# Patient Record
Sex: Male | Born: 1950 | Race: White | Hispanic: No | Marital: Married | Smoking: Never smoker
Health system: Southern US, Community
[De-identification: ages and names within clinical notes are randomized; demographics above are authoritative.]

## PROBLEM LIST (undated history)

## (undated) DIAGNOSIS — I10 Essential (primary) hypertension: Secondary | ICD-10-CM

## (undated) DIAGNOSIS — M549 Dorsalgia, unspecified: Secondary | ICD-10-CM

## (undated) HISTORY — DX: Dorsalgia, unspecified: M54.9

## (undated) HISTORY — PX: POLYPECTOMY: SHX149

## (undated) HISTORY — PX: NO PAST SURGERIES: SHX2092

## (undated) HISTORY — DX: Essential (primary) hypertension: I10

## (undated) HISTORY — PX: COLONOSCOPY: SHX174

---

## 2004-06-28 ENCOUNTER — Encounter: Payer: Self-pay | Admitting: Internal Medicine

## 2005-04-10 ENCOUNTER — Ambulatory Visit (HOSPITAL_COMMUNITY): Admission: RE | Admit: 2005-04-10 | Discharge: 2005-04-10 | Payer: Self-pay | Admitting: Plastic Surgery

## 2005-04-10 ENCOUNTER — Ambulatory Visit (HOSPITAL_BASED_OUTPATIENT_CLINIC_OR_DEPARTMENT_OTHER): Admission: RE | Admit: 2005-04-10 | Discharge: 2005-04-10 | Payer: Self-pay | Admitting: Plastic Surgery

## 2005-04-10 ENCOUNTER — Encounter (INDEPENDENT_AMBULATORY_CARE_PROVIDER_SITE_OTHER): Payer: Self-pay | Admitting: *Deleted

## 2007-01-27 ENCOUNTER — Ambulatory Visit: Payer: Self-pay | Admitting: Internal Medicine

## 2007-01-27 LAB — CONVERTED CEMR LAB
Basophils Absolute: 0 10*3/uL (ref 0.0–0.1)
Basophils Relative: 0.2 % (ref 0.0–1.0)
Eosinophils Absolute: 0.1 10*3/uL (ref 0.0–0.6)
Eosinophils Relative: 1.1 % (ref 0.0–5.0)
HCT: 44.8 % (ref 39.0–52.0)
Hemoglobin: 15.2 g/dL (ref 13.0–17.0)
Lymphocytes Relative: 33.3 % (ref 12.0–46.0)
MCHC: 34.1 g/dL (ref 30.0–36.0)
MCV: 92.2 fL (ref 78.0–100.0)
Monocytes Absolute: 0.6 10*3/uL (ref 0.2–0.7)
Monocytes Relative: 9.6 % (ref 3.0–11.0)
Neutro Abs: 3.2 10*3/uL (ref 1.4–7.7)
Neutrophils Relative %: 55.8 % (ref 43.0–77.0)
Platelets: 230 10*3/uL (ref 150–400)
RBC: 4.85 M/uL (ref 4.22–5.81)
RDW: 12.6 % (ref 11.5–14.6)
WBC: 5.9 10*3/uL (ref 4.5–10.5)

## 2007-02-12 ENCOUNTER — Ambulatory Visit: Payer: Self-pay | Admitting: Internal Medicine

## 2007-02-12 LAB — CONVERTED CEMR LAB
Fecal Occult Blood: NEGATIVE
Fecal Occult Blood: NEGATIVE
OCCULT 1: NEGATIVE
OCCULT 1: NEGATIVE
OCCULT 2: NEGATIVE
OCCULT 2: NEGATIVE
OCCULT 3: NEGATIVE
OCCULT 3: NEGATIVE
OCCULT 4: NEGATIVE
OCCULT 4: NEGATIVE
OCCULT 5: NEGATIVE
OCCULT 5: NEGATIVE

## 2007-10-30 ENCOUNTER — Encounter: Payer: Self-pay | Admitting: Family Medicine

## 2007-10-30 DIAGNOSIS — Z593 Problems related to living in residential institution: Secondary | ICD-10-CM | POA: Insufficient documentation

## 2009-07-26 ENCOUNTER — Ambulatory Visit: Payer: Self-pay | Admitting: Internal Medicine

## 2009-07-26 DIAGNOSIS — K625 Hemorrhage of anus and rectum: Secondary | ICD-10-CM | POA: Insufficient documentation

## 2009-08-07 ENCOUNTER — Ambulatory Visit: Payer: Self-pay | Admitting: Internal Medicine

## 2009-08-08 ENCOUNTER — Encounter: Payer: Self-pay | Admitting: Internal Medicine

## 2010-11-01 NOTE — Procedures (Signed)
Summary: colonoscopy   Colonoscopy  Procedure date:  06/28/2004  Findings:      Location:  [facility].  Results: Normal.  Patient Name: Micheal Schultz, Micheal Schultz MRN:  Procedure Procedures: Colonoscopy CPT: [id number].  Personnel: Endoscopist: Wilhemina Bonito. Marina Goodell, MD.  Referred By: Pearletha Furl Jacky Kindle, MD.  Exam Location: Exam performed in Outpatient Clinic. Outpatient  Patient Consent: Procedure, Alternatives, Risks and Benefits discussed, consent obtained, from patient. Consent was obtained by the RN.  Indications  Average Risk Screening Routine.  History  Current Medications: Patient is not currently taking Coumadin.  Pre-Exam Physical: Performed Jun 28, 2004. Entire physical exam was normal.  Exam Exam: Extent of exam reached: Cecum, extent intended: Cecum.  The cecum was identified by appendiceal orifice and IC valve. Patient position: on left side. Colon retroflexion performed. Images taken. ASA Classification: I. Tolerance: excellent.  Monitoring: Pulse and BP monitoring, Oximetry used. Supplemental O2 given.  Colon Prep Used MIRALAX for colon prep. Prep results: excellent.  Sedation Meds: Patient assessed and found to be appropriate for moderate (conscious) sedation. Fentanyl 50 mcg. given IV. Versed 5 mg. given IV.  Findings NORMAL EXAM: Cecum to Rectum.    Comments: NO POLYPS SEEN Assessment Normal examination.  Events  Unplanned Interventions: No intervention was required.  Unplanned Events: There were no complications. Plans Disposition: After procedure patient sent to recovery. After recovery patient sent home.  Scheduling/Referral: Colonoscopy, to Wilhemina Bonito. Marina Goodell, MD, IN ABOUT 7 YEARS FOR REPEAT SCREENING,   Comments: RETURN TO THE CARE OF DR. Jacky Kindle  This report was created from the original endoscopy report, which was reviewed and signed by the above listed endoscopist.   cc:  Geoffry Paradise, MD      The Patient

## 2010-11-01 NOTE — Procedures (Signed)
Summary: Colonoscopy  Patient: [name] Note: All result statuses are Final unless otherwise noted.  Tests: (1) Colonoscopy (COL)   COL Colonoscopy           DONE     [facility]     [address]     [place], [place]  [address]           COLONOSCOPY PROCEDURE REPORT           PATIENT:  Micheal Schultz, Micheal Schultz  MR#:  [id number]     BIRTHDATE:  10/22/1950, 58 yrs. old  GENDER:  male           ENDOSCOPIST:  Wilhemina Bonito. Eda Keys, MD     Referred by:  Office           PROCEDURE DATE:  08/07/2009     PROCEDURE:  Colonoscopy with snare polypectomy     ASA CLASS:  Class I     INDICATIONS:  rectal bleeding           MEDICATIONS:   Fentanyl 100 mcg IV, Versed 10 mg IV           DESCRIPTION OF PROCEDURE:   After the risks benefits and     alternatives of the procedure were thoroughly explained, informed     consent was obtained.  Digital rectal exam was performed and     revealed no abnormalities.   The LB CF-[id number] endoscope     was introduced through the anus and advanced to the cecum, which     was identified by both the appendix and ileocecal valve, without     limitations.Time to cecum = 4:44 min. The quality of the prep was     excellent, using MoviPrep.  The instrument was then withdrawn     (time = 11:40 min) as the colon was fully examined.     <<PROCEDUREIMAGES>>           FINDINGS:  Three polyps were found; 2mm, 2mm in the ascending     colon and 2mm in rectum. Polyps were snared without cautery.     Retrieval was successful. Moderate focal diverticulosis in sigmoid     colon.   Retroflexed views in the rectum revealed internal     hemorrhoids.    The scope was then withdrawn from the patient and     the procedure completed.           COMPLICATIONS:  None           ENDOSCOPIC IMPRESSION:     1) Three polyps - removed     2) Internal hemorrhoids- small     3) Sigmoid Diverticulosis           RECOMMENDATIONS:     1) Repeat colonoscopy in 5 years if polyp  adenomatous; otherwise     10 years           ______________________________     Wilhemina Bonito. Eda Keys, MD           CC:  Geoffry Paradise, MD; The Patient           n.     eSIGNED:   Wilhemina Bonito. Eda Keys at 08/07/2009 10:41 AM           Shaune Spittle, [id number]  Note: An exclamation mark (!) indicates a result that was not dispersed into the flowsheet. Document Creation Date: 08/07/2009 10:41 AM _______________________________________________________________________  (1) Order result status: Final Collection or  observation date-time: 08/07/2009 10:22 Requested date-time:  Receipt date-time:  Reported date-time:  Referring Physician:   Ordering Physician: Fransico Setters [phone]) Specimen Source:  Source: Launa Grill Order Number: [id number] Lab site:   Appended Document: Colonoscopy     Procedures Next Due Date:    Colonoscopy: 07/2014

## 2010-11-01 NOTE — Letter (Signed)
Summary: [facility] Instructions  [facility]  [address] [place], [place] [address]   Phone: [phone]  Fax: [phone]       Micheal Schultz    [birth year]    MRN: [id number]        Procedure Day /Date:08/07/09 Operating Room Services     Arrival Time:9:30 a.m.     Procedure Time:10:30 a.m.    Location of Procedure:                    X  [facility] (4th Floor) _ _  [facility] ( Outpatient Registration) _ _  [facility] ( Short Stay on [facility])                       PREPARATION FOR COLONOSCOPY WITH MOVIPREP   Starting 5 days prior to your procedure 08/02/09 do not eat nuts, seeds, popcorn, corn, beans, peas,  salads, or any raw vegetables.  Do not take any fiber supplements (e.g. Metamucil, Citrucel, and Benefiber).  THE DAY BEFORE YOUR PROCEDURE         DATE: 08/06/09 DAY: SUNDAY  1.  Drink clear liquids the entire day-NO SOLID FOOD  2.  Do not drink anything colored red or purple.  Avoid juices with pulp.  No orange juice.  3.  Drink at least 64 oz. (8 glasses) of fluid/clear liquids during the day to prevent dehydration and help the prep work efficiently.  CLEAR LIQUIDS INCLUDE: Water Jello Ice Popsicles Tea (sugar ok, no milk/cream) Powdered fruit flavored drinks Coffee (sugar ok, no milk/cream) Gatorade Juice: apple, white grape, white cranberry  Lemonade Clear bullion, consomm, broth Carbonated beverages (any kind) Strained chicken noodle soup Hard Candy                             4.  In the morning, mix first dose of MoviPrep solution:    Empty 1 Pouch A and 1 Pouch B into the disposable container    Add lukewarm drinking water to the top line of the container. Mix to dissolve    Refrigerate (mixed solution should be used within 24 hrs)  5.  Begin drinking the prep at 5:00 p.m. The MoviPrep container is divided by 4 marks.   Every 15 minutes drink the solution down to the next mark (approximately 8 oz) until the  full liter is complete.   6.  Follow completed prep with 16 oz of clear liquid of your choice (Nothing red or purple).  Continue to drink clear liquids until bedtime.  7.  Before going to bed, mix second dose of MoviPrep solution:    Empty 1 Pouch A and 1 Pouch B into the disposable container    Add lukewarm drinking water to the top line of the container. Mix to dissolve    Refrigerate  THE DAY OF YOUR PROCEDURE      DATE: 08/07/09 DAY: MONDAY  Beginning at 5:30 a.m. (5 hours before procedure):         1. Every 15 minutes, drink the solution down to the next mark (approx 8 oz) until the full liter is complete.  2. Follow completed prep with 16 oz. of clear liquid of your choice.    3. You may drink clear liquids until 8:30 a.m.(2 HOURS BEFORE PROCEDURE).   MEDICATION INSTRUCTIONS  Unless otherwise instructed, you should take regular prescription medications with a  small sip of water   as early as possible the morning of your procedure.         OTHER INSTRUCTIONS  You will need a responsible adult at least 58 years of age to accompany you and drive you home.   This person must remain in the waiting room during your procedure.  Wear loose fitting clothing that is easily removed.  Leave jewelry and other valuables at home.  However, you may wish to bring a book to read or  an iPod/MP3 player to listen to music as you wait for your procedure to start.  Remove all body piercing jewelry and leave at home.  Total time from sign-in until discharge is approximately 2-3 hours.  You should go home directly after your procedure and rest.  You can resume normal activities the  day after your procedure.  The day of your procedure you should not:   Drive   Make legal decisions   Operate machinery   Drink alcohol   Return to work  You will receive specific instructions about eating, activities and medications before you leave.    The above instructions have been  reviewed and explained to me by   _______________________    I fully understand and can verbalize these instructions _____________________________ Date _________

## 2010-11-01 NOTE — Miscellaneous (Signed)
  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 

## 2010-11-01 NOTE — Letter (Signed)
Summary: Patient Notice- Polyp Results  [facility]  [address] [place], [place] [address]   Phone: [phone]  Fax: [phone]        August 08, 2009 MRN: [id number]    Micheal Schultz [address] [place], [place]  [address]    Dear [name],    I am pleased to inform you that the colon polyps removed during your recent colonoscopy were found to be benign (no cancer detected) upon pathologic examination. They were, however, the precancerous adenomatous type.  I recommend you have a repeat colonoscopy examination in 5 years to look for recurrent polyps, as having colon polyps increases your risk for having recurrent polyps or even colon cancer in the future.  Should you develop new or worsening symptoms of abdominal pain, bowel habit changes or bleeding from the rectum or bowels, please schedule an evaluation with either your primary care physician or with me.  Additional information/recommendations:  __ No further action with gastroenterology is needed at this time. Please      follow-up with your primary care physician for your other healthcare      needs.   Please call us if you are having persistent problems or have questions about your condition that have not been fully answered at this time.  Sincerely,  Hilarie Fredrickson MD  This letter has been electronically signed by your physician.  Appended Document: Patient Notice- Polyp Results Letter mailed 11.10.10

## 2010-11-01 NOTE — Assessment & Plan Note (Signed)
Summary: RECTAL BLEEDING...EM   History of Present Illness Visit Type: consult  Primary GI MD: Yancey Flemings MD Primary Provider: Geoffry Paradise, MD  Requesting Provider: Geoffry Paradise, MD Chief Complaint: Pt states BRB in stool after bowel movements  History of Present Illness:   60 year old with no significant past medical history. He presents today regarding rectal bleeding. The patient underwent colonoscopy in September of 2005 for routine screening. Examination was normal. He was seen in April 2008 for rectal bleeding. No worrisome features. He was felt to have benign anorectal pathology. Hemoccult studies were negative. At this time he reports recurrent rectal bleeding about 6 weeks ago. He describes 3 discrete episodes. No associated abdominal rectal pain. No nausea vomiting or unexplained weight loss. No interval development of colon cancer in his family. Hemoccult studies in June were negative. Review of outside blood work reveals normal hemoglobin of 16.1. CBC and comprehensive metabolic panel entirely normal as well.   GI Review of Systems      Denies abdominal pain, acid reflux, belching, bloating, chest pain, dysphagia with liquids, dysphagia with solids, heartburn, loss of appetite, nausea, vomiting, vomiting blood, weight loss, and  weight gain.      Reports rectal bleeding.     Denies anal fissure, black tarry stools, change in bowel habit, constipation, diarrhea, diverticulosis, fecal incontinence, heme positive stool, hemorrhoids, irritable bowel syndrome, jaundice, light color stool, liver problems, and  rectal pain.    Current Medications (verified): 1)  None  Allergies (verified): No Known Drug Allergies  Past History:  Past Medical History: Reviewed history from 07/21/2009 and no changes required. Unremarkable  Past Surgical History: Reviewed history from 07/21/2009 and no changes required. Unremarkable  Family History: No FH of Colon Cancer:  Social  History: Occupation: Administrator  Married  Patient currently smokes.: Occ cigar  Alcohol Use - yes: Glass of wine daily and on weekends  Daily Caffeine Use: coffee in the morning  Illicit Drug Use - no Patient gets regular exercise. Smoking Status:  current Drug Use:  no Does Patient Exercise:  yes  Review of Systems       entirely negative review of systems  Vital Signs:  Patient profile:   60 year old male Height:      70 inches Weight:      183 pounds BMI:     26.35 BSA:     2.01 Pulse rate:   72 / minute Pulse rhythm:   regular BP sitting:   116 / 80  (left arm) Cuff size:   regular  Vitals Entered By: Ok Anis CMA (July 26, 2009 8:38 AM)  Physical Exam  General:  Well developed, well nourished, no acute distress. Head:  Normocephalic and atraumatic. Eyes:  PERRLA, no icterus. Nose:  No deformity, discharge,  or lesions. Mouth:  No deformity or lesions, dentition normal. Neck:  Supple; no masses or thyromegaly. Lungs:  Clear throughout to auscultation. Heart:  Regular rate and rhythm; no murmurs, rubs,  or bruits. Abdomen:  Soft, nontender and nondistended. No masses, hepatosplenomegaly or hernias noted. Normal bowel sounds. Rectal:  deferred Prostate:  deferred Msk:  Symmetrical with no gross deformities. Normal posture. Pulses:  Normal pulses noted. Extremities:  No clubbing, cyanosis, edema or deformities noted. Neurologic:  Alert and  oriented x4;  grossly normal neurologically. Skin:  Intact without significant lesions or rashes. Psych:  Alert and cooperative. Normal mood and affect.   Impression & Recommendations:  Problem # 1:  RECTAL BLEEDING (ICD-569.3) recurrent  rectal bleeding about 6 weeks ago. Suspect benign anorectal pathology. Last colonoscopy a little over 5 years ago.  Plan: Colonoscopy. The nature of the procedure as well as the risks, benefits, and alternatives have been reviewed. He understood and agreed to proceed. Moving  prep prescribed. The patient instructed on its use.  Other Orders: Colonoscopy (Colon)  Patient Instructions: 1)  Colon [facility] 08/07/09 10:30 am 2)  Movi prep instructions given to patient 3)  Movi prep Rx. sent to pharmacy. 4)  Colonoscopy and Flexible Sigmoidoscopy brochure given.  5)  The medication list was reviewed and reconciled.  All changed / newly prescribed medications were explained.  A complete medication list was provided to the patient / caregiver. 6)  Copy: Dr. Geoffry Paradise Prescriptions: MOVIPREP 100 GM  SOLR (PEG-KCL-NACL-NASULF-NA ASC-C) As per prep instructions.  #1 x 0   Entered by:   Milford Cage NCMA   Authorized by:   Hilarie Fredrickson MD   Signed by:   Milford Cage NCMA on 07/26/2009   Method used:   Electronically to        [facility] [phone]* (retail)       [address]       [address]       [place], [place]  [address]       Ph: [phone]       Fax: [phone]   RxID:   [id number]

## 2011-02-15 NOTE — Op Note (Signed)
NAME:  Micheal Schultz, Micheal Schultz NO.:  [id number]   MEDICAL RECORD NO.:  [id number]          PATIENT TYPE:  AMB   LOCATION:  [facility]                          FACILITY:  [facility]   PHYSICIAN:  Alfredia Ferguson, M.D.  DATE OF BIRTH:  Feb 19, 1951   DATE OF PROCEDURE:  04/10/2005  DATE OF DISCHARGE:                                 OPERATIVE REPORT   PREOPERATIVE DIAGNOSES:  1.  7-mm pigmented nevus, left cheek.  2.  6-mm pigmented nevus, left cheek alar junction.  3.  1-cm sebaceous cyst, right cheek near oral commissure.   POSTOPERATIVE DIAGNOSES:  1.  7-mm pigmented nevus, left cheek.  2.  6-mm pigmented nevus, left cheek alar junction.  3.  1-cm sebaceous cyst, right cheek near oral commissure.   OPERATION PERFORMED:  Excision of pigmented nevus left cheek, left alar  cheek junction and removal of cyst right cheek.   SURGEON:  Alfredia Ferguson, M.D.   ANESTHESIA:  2% Xylocaine and 1:100,000 epinephrine.   INDICATIONS FOR SURGERY:  This is a 60 year old gentleman with two pigmented  nevi on his left cheek and at the cheek alar junction.  The one in the  central left cheek is irritated when he shaves almost on a daily basis.  It  has been getter larger and approximately 7 mm in diameter.  He would like to  have it removed.  The one in his alar cheek junction on the left side is  also enlarging.  He wishes to have it removed.  The patient also has a 2-  year history of a slowly enlarging cyst near the right oral commissure.  He  wishes to have that excised also.  He understands he is trading all three of  these areas for potentially unsightly scars.   DESCRIPTION OF SURGERY:  Skin marks were placed in elliptical fashion around  the lesion on the left cheek and the left alar cheek junction and the skin  overlying the cyst on the right cheek.  Local anesthesia was infiltrated and  the left and right face were prepped with Betadine and draped with sterile  drapes.  The lesion in  the left cheek was excised in an elliptical fashion  down to the level of subcutaneous tissue.  The specimen was submitted for  pathology.  The wound edges were undermined for a distance of several  millimeters. The wound was closed by approximating the dermis with  interrupted 5-0 Monocryl suture.  The skin edges were united with a running  6-0 nylon suture.  The lesion in the left cheek alar junction was excised in  elliptical fashion down to the level of subcutaneous tissue.  The specimen  submitted for pathology.  The wound edges were undermined for a distance of  1 or 2 mm.  Hemostasis was accomplished using electrocautery.  The wound was  closed with a combination of interrupted simple 6-0 nylons and horizontal  mattress 6-0 nylons.  An elliptical skin incision was made overlying the  cyst on the right cheek.  Once the cyst was visualized, it was dissected out  of its bed using scissor dissection.  Hemostasis was accomplished using  pressure.  The specimen was submitted for pathology. The wound was closed  by approximating the dermis with interrupted 5-0 Monocryl suture.  The skin  edges were united with a running 6-0 nylon suture.  The patient tolerated  the procedure well.  His face was cleansed and dried and light dressings  were applied.      Alfredia Ferguson, M.D.  Electronically Signed     WBB/MEDQ  D:  04/10/2005  T:  04/10/2005  Job:  [id number]

## 2011-02-15 NOTE — Assessment & Plan Note (Signed)
[facility]                         GASTROENTEROLOGY OFFICE NOTE   Micheal Schultz, Micheal Schultz                      MRN:          [id number]  DATE:01/27/2007                            DOB:          [birth year]    REASON FOR CONSULTATION:  Rectal bleeding.   HISTORY:  This is a 60 year old white male with no significant medical  problems who presents today upon referal from Dr. Jacky Kindle regarding  rectal bleeding. The patient was seen on 1 previous occasion, as a  direct referal for screening colonoscopy. That examination was carried  out June 28, 2004. The examination was complete and the preparation  excellent. Exam was entirely normal with no abnormalities found. Follow  up in 7 years recommended. No family history of colon cancer or colon  polyps. Patient reports being in his usual state of good health until  about 2 and a half weeks ago when he noticed some orangish material in  the toilet water. He was not sure if this was from his urine or stool.  Subsequent 2 days more reddish type material that he thought was more  consistent with blood. Possibly affiliated with his stool  though this  is not entirely certain. He did see Dr. Jacky Kindle who performed rectal  exam with no abnormalities found. He also underwent urinalysis which was  normal. No recurrent problems since that time. At the time of bleeding  he had no associated symptoms such as, abdominal pain, change in bowel  habits, constipation, or rectal discomfort. His weight has been stable.   PAST MEDICAL HISTORY:  None.   PAST SURGICAL HISTORY:  None.   ALLERGIES:  None.   REGULAR MEDICATIONS:  Vitamin C.   FAMILY HISTORY:  Negative for gastrointestinal malignancy.   SOCIAL HISTORY:  Patient is married with 3 children. He has a Dietitian, works in IT consultant. He has a rare cigar, a glass  of wine each evening.   REVIEW OF SYSTEMS:  Per diagnostic evaluation form.   PHYSICAL EXAMINATION:  Well-appearing male in no acute distress. He is  alert and oriented. Blood pressure is 126/78, heart rate 76, weight is  185.2 pounds.  He is 5 feet 11 inches in height.  HEENT: Sclera anicteric, conjunctivae are pink. Oral mucosa is intact.  There is no adenopathy.  LUNGS: Clear.  HEART: Regular.  ABDOMEN:  Soft, without tenderness, mass, or hernia.  RECTAL EXAM: Not repeated. Recent exam by Dr. Jacky Kindle noted.  EXTREMITIES: Without edema.   IMPRESSION:  This is a 60 year old gentleman who presents with possible  transient minor rectal bleeding. Negative recent rectal exam. Normal  complete colonoscopy about 2 and a half years ago. No worrisome features  by history or exam. I suspect that if he did have some transient rectal  bleeding that it was due to minor inner rectal pathologies such as a  small fissure or even small hemorrhoid. I think that the chance of  significant pathology is quite small. We had a frank discussion today  regarding this issue. We discussed different options including;  expectant management or repeat colonoscopy.  At this point we decided to  check his CBC to make sure there is no evidence of anemia as well, check  stool Hemoccult cards. If these are abnormal, then I would advise  proceeding to a colonoscopy at this time. If these are normal and no  further bleeding, then expectant management. However, if he develops  recurrent bleeding at anytime, then, I suspect that we would move  forward with colonoscopic exam. He understands and is comfortable with  this approach.     Wilhemina Bonito. Marina Goodell, MD  Electronically Signed    JNP/MedQ  DD: 01/27/2007  DT: 01/27/2007  Job #: [id number]   cc:   Geoffry Paradise, M.D.

## 2014-05-23 ENCOUNTER — Other Ambulatory Visit: Payer: Self-pay | Admitting: Internal Medicine

## 2014-05-23 DIAGNOSIS — M545 Low back pain, unspecified: Secondary | ICD-10-CM

## 2014-05-27 ENCOUNTER — Other Ambulatory Visit: Payer: Self-pay

## 2014-05-31 ENCOUNTER — Other Ambulatory Visit: Payer: Self-pay

## 2014-06-03 ENCOUNTER — Ambulatory Visit
Admission: RE | Admit: 2014-06-03 | Discharge: 2014-06-03 | Disposition: A | Discharge status: Home / Self Care | Payer: BC Managed Care – PPO | Source: Ambulatory Visit | Attending: Internal Medicine | Admitting: Internal Medicine

## 2014-06-03 ENCOUNTER — Other Ambulatory Visit: Payer: Self-pay | Admitting: Internal Medicine

## 2014-06-03 DIAGNOSIS — M5489 Other dorsalgia: Secondary | ICD-10-CM

## 2014-06-15 ENCOUNTER — Ambulatory Visit: Payer: BC Managed Care – PPO | Attending: Internal Medicine | Admitting: Physical Therapy

## 2014-06-15 DIAGNOSIS — M545 Low back pain, unspecified: Secondary | ICD-10-CM | POA: Insufficient documentation

## 2014-06-15 DIAGNOSIS — IMO0001 Reserved for inherently not codable concepts without codable children: Secondary | ICD-10-CM | POA: Diagnosis not present

## 2014-06-23 ENCOUNTER — Ambulatory Visit: Payer: BC Managed Care – PPO

## 2014-06-23 ENCOUNTER — Ambulatory Visit: Payer: BC Managed Care – PPO | Admitting: Physical Therapy

## 2014-06-23 DIAGNOSIS — IMO0001 Reserved for inherently not codable concepts without codable children: Secondary | ICD-10-CM | POA: Diagnosis not present

## 2014-06-29 ENCOUNTER — Ambulatory Visit: Payer: BC Managed Care – PPO | Admitting: Physical Therapy

## 2014-06-29 DIAGNOSIS — IMO0001 Reserved for inherently not codable concepts without codable children: Secondary | ICD-10-CM | POA: Diagnosis not present

## 2014-07-07 ENCOUNTER — Ambulatory Visit: Payer: BC Managed Care – PPO | Attending: Internal Medicine

## 2014-07-07 DIAGNOSIS — Z5189 Encounter for other specified aftercare: Secondary | ICD-10-CM | POA: Diagnosis present

## 2014-07-07 DIAGNOSIS — M545 Low back pain: Secondary | ICD-10-CM | POA: Diagnosis not present

## 2014-07-12 ENCOUNTER — Ambulatory Visit: Payer: BC Managed Care – PPO

## 2014-07-12 DIAGNOSIS — Z5189 Encounter for other specified aftercare: Secondary | ICD-10-CM | POA: Diagnosis not present

## 2014-07-13 ENCOUNTER — Encounter: Payer: Self-pay | Admitting: Internal Medicine

## 2014-07-25 ENCOUNTER — Ambulatory Visit: Payer: BC Managed Care – PPO | Admitting: Physical Therapy

## 2014-07-25 ENCOUNTER — Encounter: Payer: Self-pay | Admitting: Internal Medicine

## 2014-07-25 DIAGNOSIS — Z5189 Encounter for other specified aftercare: Secondary | ICD-10-CM | POA: Diagnosis not present

## 2014-08-02 ENCOUNTER — Ambulatory Visit (AMBULATORY_SURGERY_CENTER): Payer: Self-pay

## 2014-08-02 VITALS — Ht 70.0 in | Wt 188.6 lb

## 2014-08-02 DIAGNOSIS — Z86010 Personal history of colonic polyps: Secondary | ICD-10-CM

## 2014-08-02 MED ORDER — MOVIPREP 100 G PO SOLR: ORAL | Status: DC

## 2014-08-02 NOTE — Progress Notes (Signed)
Per pt, no allergies to soy or egg products.Pt not taking any weight loss meds or using  O2 at home. 

## 2014-08-10 ENCOUNTER — Encounter: Payer: Self-pay | Admitting: Internal Medicine

## 2014-08-10 ENCOUNTER — Ambulatory Visit (AMBULATORY_SURGERY_CENTER): Payer: BC Managed Care – PPO | Admitting: Internal Medicine

## 2014-08-10 VITALS — BP 124/82 | HR 56 | Temp 97.2°F | Resp 17 | Ht 70.0 in | Wt 188.0 lb

## 2014-08-10 DIAGNOSIS — Z86010 Personal history of colonic polyps: Secondary | ICD-10-CM

## 2014-08-10 DIAGNOSIS — D122 Benign neoplasm of ascending colon: Secondary | ICD-10-CM

## 2014-08-10 MED ORDER — SODIUM CHLORIDE 0.9 % IV SOLN: 500.0000 mL | INTRAVENOUS | Status: DC

## 2014-08-10 NOTE — Patient Instructions (Signed)

## 2014-08-10 NOTE — Op Note (Signed)
[facility]  [address]. [place], [address]   COLONOSCOPY PROCEDURE REPORT  PATIENT: Micheal Schultz, Micheal Schultz  MR#: [id number] BIRTHDATE: 01/31/1951 , 63  yrs. old GENDER: male ENDOSCOPIST: Eustace Quail, MD REFERRED NB:[id number] Program Recall PROCEDURE DATE:  08/10/2014 PROCEDURE:   Colonoscopy with snare polypectomy x 1 First Screening Colonoscopy - Avg.  risk and is 50 yrs.  old or older - No.  Prior Negative Screening - Now for repeat screening. N/A  History of Adenoma - Now for follow-up colonoscopy & has been > or = to 3 yrs.  Yes hx of adenoma.  Has been 3 or more years since last colonoscopy.  Polyps Removed Today? Yes. ASA CLASS:   Class II INDICATIONS:surveillance colonoscopy based on a history of adenomatous colonic polyp(s). Index exam (2005) was negative. Follow-up examination 2010 (for bleeding) revealed 2 small adenomas. MEDICATIONS: Monitored anesthesia care and Propofol 300 mg IV  DESCRIPTION OF PROCEDURE:   After the risks benefits and alternatives of the procedure were thoroughly explained, informed consent was obtained.  The digital rectal exam revealed no abnormalities of the rectum.   The [id number]  endoscope was introduced through the anus and advanced to the cecum, which was identified by both the appendix and ileocecal valve. No adverse events experienced.   The quality of the prep was excellent, using MoviPrep  The instrument was then slowly withdrawn as the colon was fully examined.  COLON FINDINGS: A sessile polyp measuring 6 mm in size was found in the ascending colon.  A polypectomy was performed with a cold snare.  The resection was complete, the polyp tissue was completely retrieved and sent to histology.   There was moderate diverticulosis noted in the sigmoid colon.   The examination was otherwise normal.  Retroflexed views revealed internal hemorrhoids. The time to cecum=3 minutes 04 seconds.  Withdrawal time=11  minutes 47 seconds.  The scope was withdrawn and the procedure completed. COMPLICATIONS: There were no immediate complications.  ENDOSCOPIC IMPRESSION: 1.   Sessile polyp measuring 6 mm in size was found in the ascending colon; polypectomy was performed with a cold snare 2.   Moderate diverticulosis was noted in the sigmoid colon 3.   The examination was otherwise normal  RECOMMENDATIONS: 1. Follow up colonoscopy in 5 years  eSigned:  Eustace Quail, MD 08/10/2014 3:43 PM   cc: Burnard Bunting, MD and The Patient

## 2014-08-10 NOTE — Progress Notes (Signed)
Called to room to assist during endoscopic procedure.  Patient ID and intended procedure confirmed with present staff. Received instructions for my participation in the procedure from the performing physician.  

## 2014-08-10 NOTE — Progress Notes (Addendum)
Awake alert and oriented x3. Pleased with MAC. Report to RN

## 2014-08-11 ENCOUNTER — Telehealth: Payer: Self-pay | Admitting: *Deleted

## 2014-08-11 NOTE — Telephone Encounter (Signed)
No answer, left message to call if questions or concerns. 

## 2014-08-11 NOTE — Telephone Encounter (Signed)
Pt. States that he did not receive his post procedure report after colonoscopy yesterday.  Will mail this document to him.

## 2014-08-17 ENCOUNTER — Encounter: Payer: Self-pay | Admitting: Internal Medicine

## 2015-05-17 IMAGING — CR DG LUMBAR SPINE COMPLETE 4+V
5 series · 5 of 5 positions shown · non-contrast
Comparison: None.

CLINICAL DATA: Pain.

EXAM:
LUMBAR SPINE - COMPLETE 4+ VIEW

[t l-spine a.p.]
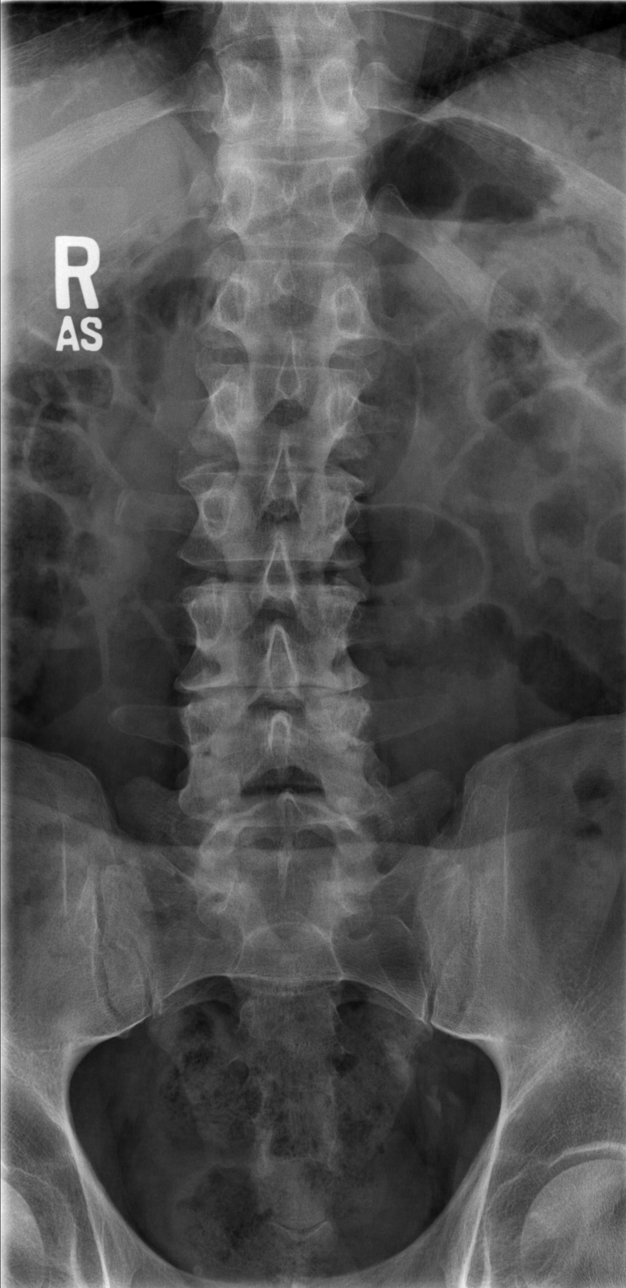

[t l-spine oblique exposure (1 of 2)]
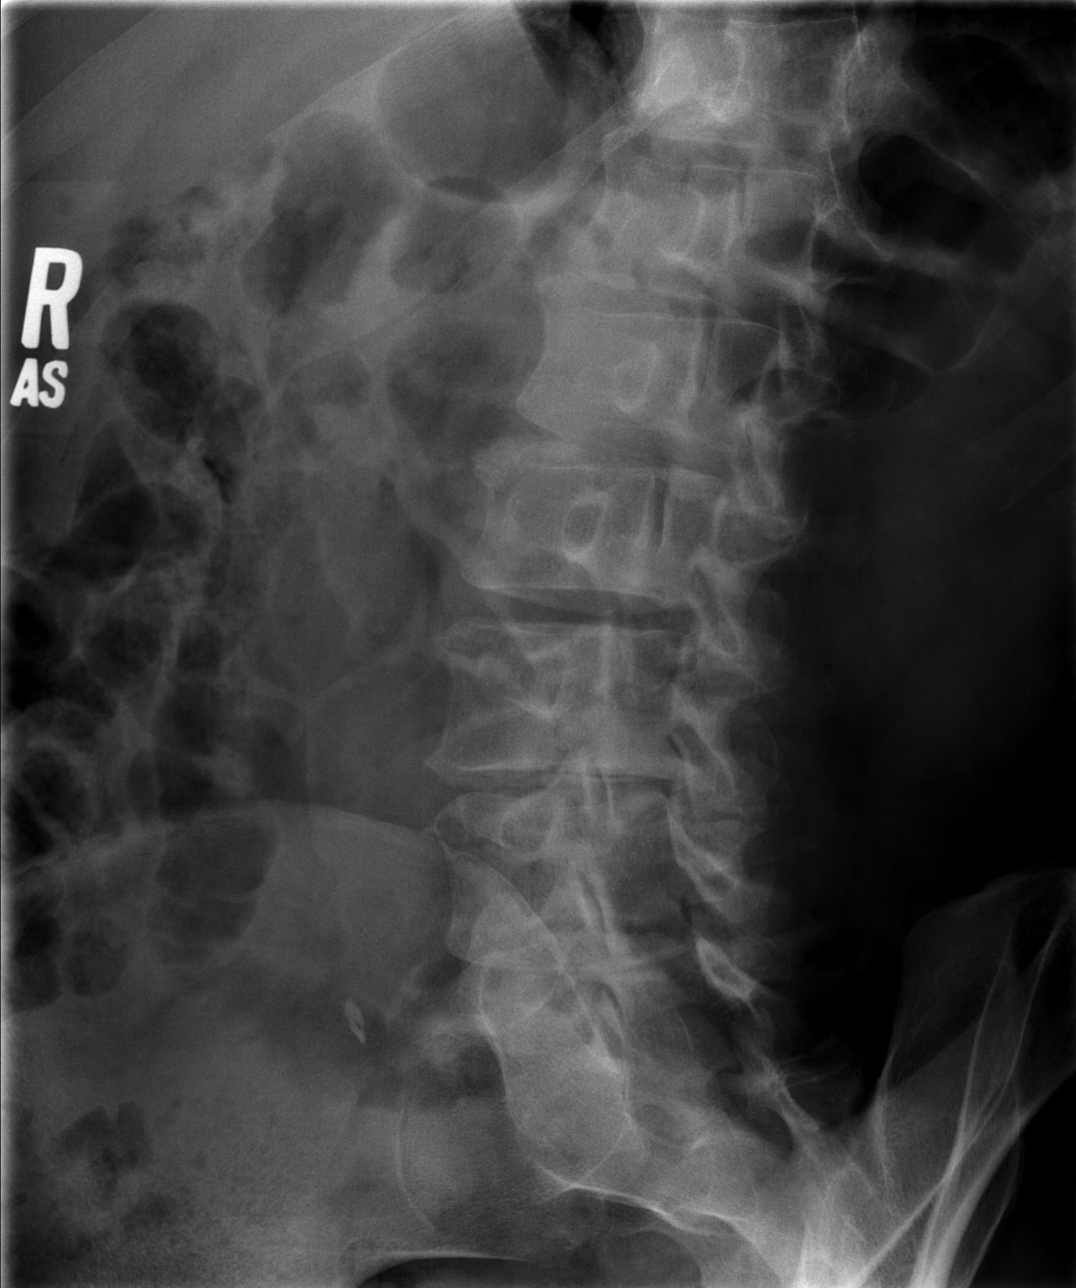

[t l-spine oblique exposure (2 of 2)]
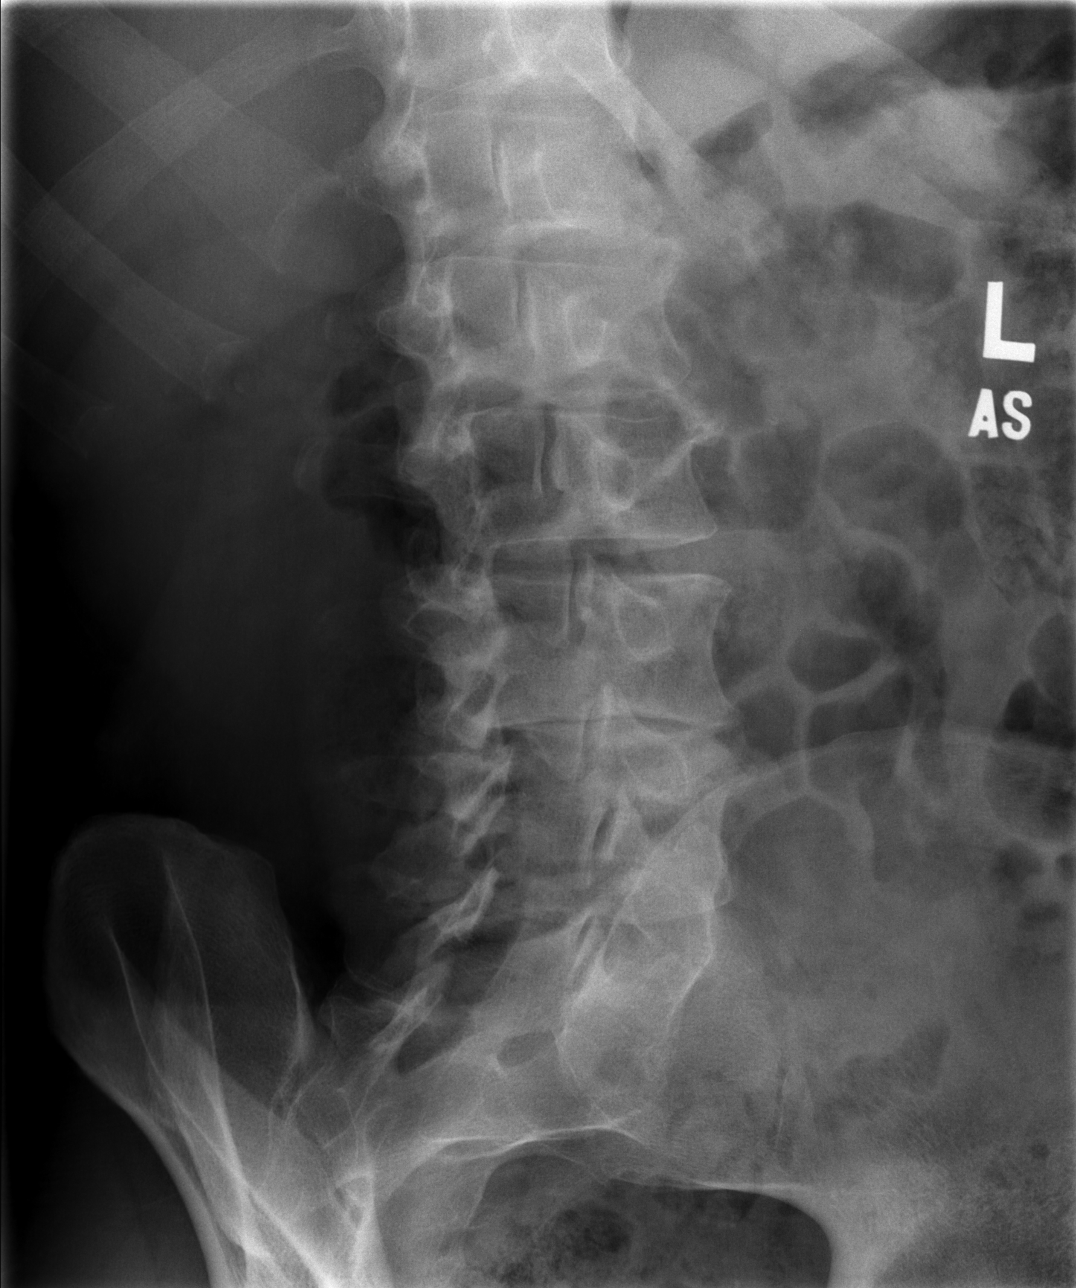

[t l-spine lat]
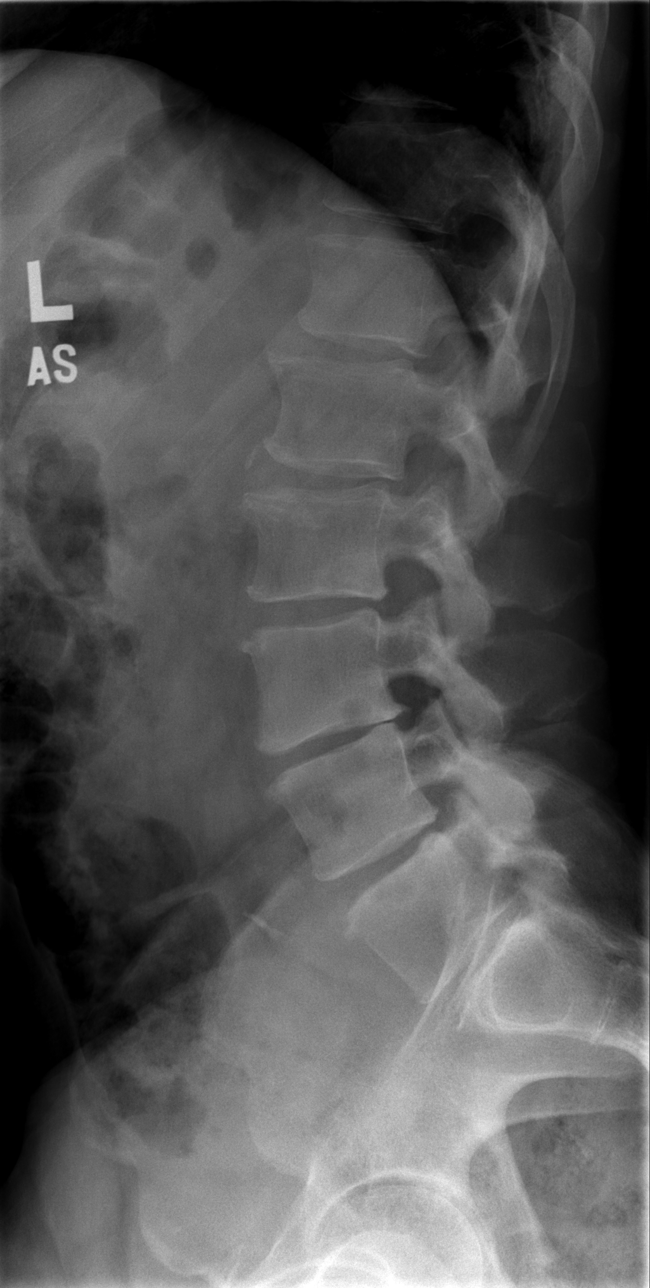

[t l-spine l5-s1 spot]
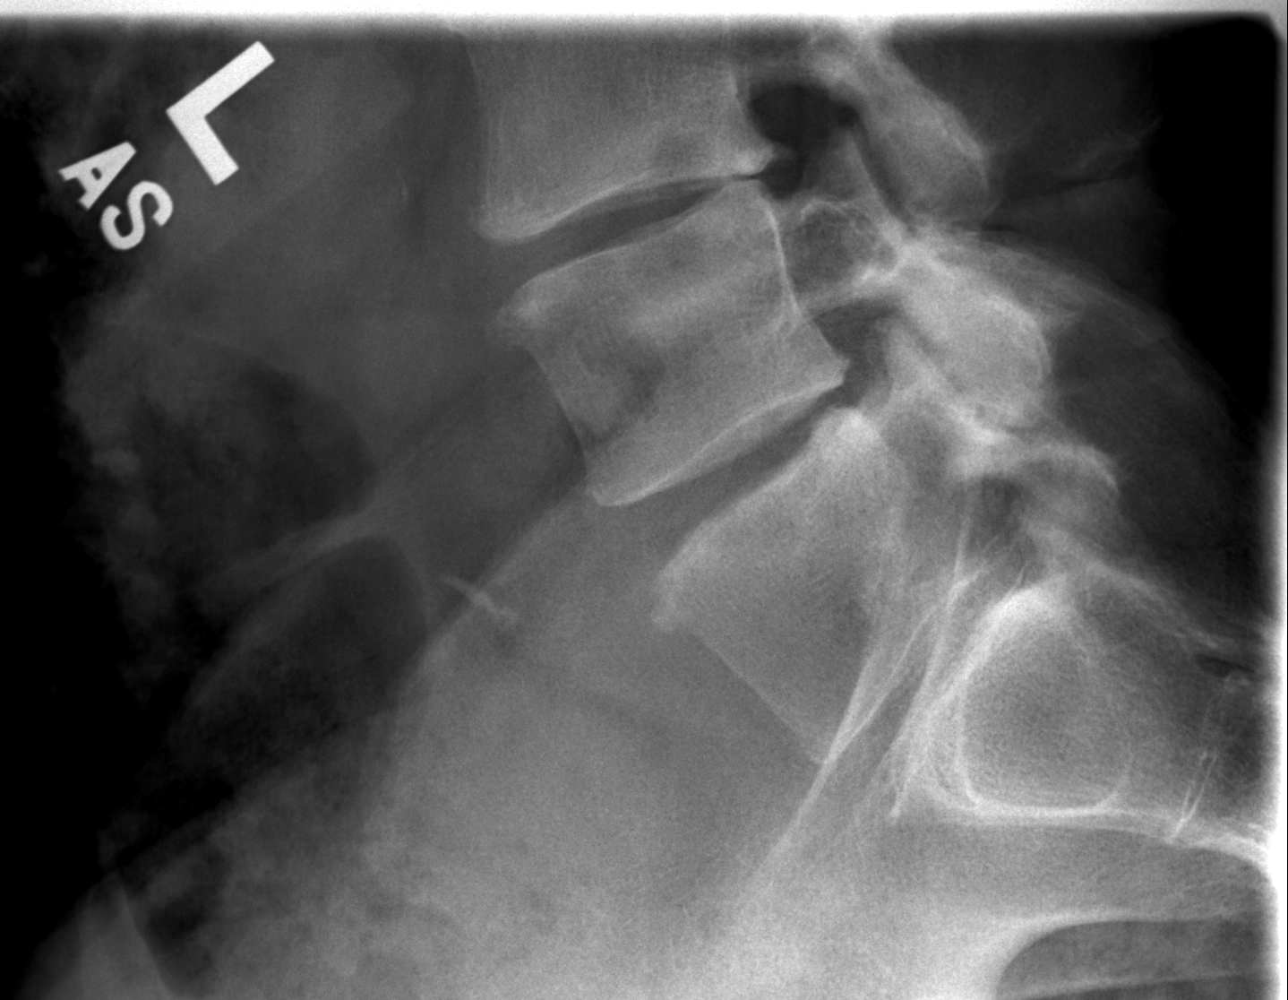

[5 of 5 positions shown; findings below may reference images not displayed]

FINDINGS: Paraspinal soft tissues normal. Diffuse degenerative change. No
acute bony abnormality.

Air-filled loops of small and large bowel noted. Mild adynamic ileus
cannot be excluded. Aortoiliac atherosclerotic vascular
calcification.
IMPRESSION: 1. Diffuse degenerative changes lumbar spine. No acute bony
abnormality.
2. Cannot exclude mild adynamic ileus.
3. Aortoiliac atherosclerotic vascular disease.

## 2016-02-07 DIAGNOSIS — L820 Inflamed seborrheic keratosis: Secondary | ICD-10-CM | POA: Diagnosis not present

## 2016-02-07 DIAGNOSIS — D1801 Hemangioma of skin and subcutaneous tissue: Secondary | ICD-10-CM | POA: Diagnosis not present

## 2016-02-07 DIAGNOSIS — L814 Other melanin hyperpigmentation: Secondary | ICD-10-CM | POA: Diagnosis not present

## 2016-02-07 DIAGNOSIS — D235 Other benign neoplasm of skin of trunk: Secondary | ICD-10-CM | POA: Diagnosis not present

## 2016-05-03 DIAGNOSIS — Z125 Encounter for screening for malignant neoplasm of prostate: Secondary | ICD-10-CM | POA: Diagnosis not present

## 2016-05-03 DIAGNOSIS — Z0000 Encounter for general adult medical examination without abnormal findings: Secondary | ICD-10-CM | POA: Diagnosis not present

## 2016-05-03 DIAGNOSIS — I10 Essential (primary) hypertension: Secondary | ICD-10-CM | POA: Diagnosis not present

## 2016-05-23 DIAGNOSIS — I10 Essential (primary) hypertension: Secondary | ICD-10-CM | POA: Diagnosis not present

## 2016-05-23 DIAGNOSIS — Z1389 Encounter for screening for other disorder: Secondary | ICD-10-CM | POA: Diagnosis not present

## 2016-05-23 DIAGNOSIS — Z0000 Encounter for general adult medical examination without abnormal findings: Secondary | ICD-10-CM | POA: Diagnosis not present

## 2016-05-23 DIAGNOSIS — M545 Low back pain: Secondary | ICD-10-CM | POA: Diagnosis not present

## 2016-05-23 DIAGNOSIS — E784 Other hyperlipidemia: Secondary | ICD-10-CM | POA: Diagnosis not present

## 2016-05-23 DIAGNOSIS — G4700 Insomnia, unspecified: Secondary | ICD-10-CM | POA: Diagnosis not present

## 2016-05-24 DIAGNOSIS — Z1212 Encounter for screening for malignant neoplasm of rectum: Secondary | ICD-10-CM | POA: Diagnosis not present

## 2016-06-13 DIAGNOSIS — Z0000 Encounter for general adult medical examination without abnormal findings: Secondary | ICD-10-CM | POA: Diagnosis not present

## 2016-06-13 DIAGNOSIS — I10 Essential (primary) hypertension: Secondary | ICD-10-CM | POA: Diagnosis not present

## 2016-11-21 DIAGNOSIS — Z1389 Encounter for screening for other disorder: Secondary | ICD-10-CM | POA: Diagnosis not present

## 2016-11-21 DIAGNOSIS — M545 Low back pain: Secondary | ICD-10-CM | POA: Diagnosis not present

## 2016-11-21 DIAGNOSIS — E784 Other hyperlipidemia: Secondary | ICD-10-CM | POA: Diagnosis not present

## 2016-11-21 DIAGNOSIS — I10 Essential (primary) hypertension: Secondary | ICD-10-CM | POA: Diagnosis not present

## 2016-11-21 DIAGNOSIS — G4709 Other insomnia: Secondary | ICD-10-CM | POA: Diagnosis not present

## 2016-12-31 DIAGNOSIS — L570 Actinic keratosis: Secondary | ICD-10-CM | POA: Diagnosis not present

## 2016-12-31 DIAGNOSIS — D235 Other benign neoplasm of skin of trunk: Secondary | ICD-10-CM | POA: Diagnosis not present

## 2016-12-31 DIAGNOSIS — L2081 Atopic neurodermatitis: Secondary | ICD-10-CM | POA: Diagnosis not present

## 2016-12-31 DIAGNOSIS — L821 Other seborrheic keratosis: Secondary | ICD-10-CM | POA: Diagnosis not present

## 2017-01-02 DIAGNOSIS — Z0101 Encounter for examination of eyes and vision with abnormal findings: Secondary | ICD-10-CM | POA: Diagnosis not present

## 2017-05-16 DIAGNOSIS — I10 Essential (primary) hypertension: Secondary | ICD-10-CM | POA: Diagnosis not present

## 2017-05-16 DIAGNOSIS — Z0000 Encounter for general adult medical examination without abnormal findings: Secondary | ICD-10-CM | POA: Diagnosis not present

## 2017-05-16 DIAGNOSIS — Z125 Encounter for screening for malignant neoplasm of prostate: Secondary | ICD-10-CM | POA: Diagnosis not present

## 2017-05-28 DIAGNOSIS — M545 Low back pain: Secondary | ICD-10-CM | POA: Diagnosis not present

## 2017-05-28 DIAGNOSIS — Z0000 Encounter for general adult medical examination without abnormal findings: Secondary | ICD-10-CM | POA: Diagnosis not present

## 2017-05-28 DIAGNOSIS — E784 Other hyperlipidemia: Secondary | ICD-10-CM | POA: Diagnosis not present

## 2017-05-28 DIAGNOSIS — G4700 Insomnia, unspecified: Secondary | ICD-10-CM | POA: Diagnosis not present

## 2017-05-28 DIAGNOSIS — I10 Essential (primary) hypertension: Secondary | ICD-10-CM | POA: Diagnosis not present

## 2017-05-28 DIAGNOSIS — Z1389 Encounter for screening for other disorder: Secondary | ICD-10-CM | POA: Diagnosis not present

## 2017-05-30 DIAGNOSIS — Z1212 Encounter for screening for malignant neoplasm of rectum: Secondary | ICD-10-CM | POA: Diagnosis not present

## 2017-11-26 DIAGNOSIS — E7849 Other hyperlipidemia: Secondary | ICD-10-CM | POA: Diagnosis not present

## 2017-11-26 DIAGNOSIS — M545 Low back pain: Secondary | ICD-10-CM | POA: Diagnosis not present

## 2017-11-26 DIAGNOSIS — I10 Essential (primary) hypertension: Secondary | ICD-10-CM | POA: Diagnosis not present

## 2017-11-26 DIAGNOSIS — G4700 Insomnia, unspecified: Secondary | ICD-10-CM | POA: Diagnosis not present

## 2017-11-26 DIAGNOSIS — Z1389 Encounter for screening for other disorder: Secondary | ICD-10-CM | POA: Diagnosis not present

## 2018-01-16 DIAGNOSIS — L814 Other melanin hyperpigmentation: Secondary | ICD-10-CM | POA: Diagnosis not present

## 2018-01-16 DIAGNOSIS — D1801 Hemangioma of skin and subcutaneous tissue: Secondary | ICD-10-CM | POA: Diagnosis not present

## 2018-01-16 DIAGNOSIS — L570 Actinic keratosis: Secondary | ICD-10-CM | POA: Diagnosis not present

## 2018-01-16 DIAGNOSIS — D225 Melanocytic nevi of trunk: Secondary | ICD-10-CM | POA: Diagnosis not present

## 2018-01-16 DIAGNOSIS — L821 Other seborrheic keratosis: Secondary | ICD-10-CM | POA: Diagnosis not present

## 2018-01-19 DIAGNOSIS — Z0101 Encounter for examination of eyes and vision with abnormal findings: Secondary | ICD-10-CM | POA: Diagnosis not present

## 2018-05-27 DIAGNOSIS — R82998 Other abnormal findings in urine: Secondary | ICD-10-CM | POA: Diagnosis not present

## 2018-05-27 DIAGNOSIS — Z0000 Encounter for general adult medical examination without abnormal findings: Secondary | ICD-10-CM | POA: Diagnosis not present

## 2018-05-27 DIAGNOSIS — I10 Essential (primary) hypertension: Secondary | ICD-10-CM | POA: Diagnosis not present

## 2018-05-27 DIAGNOSIS — Z125 Encounter for screening for malignant neoplasm of prostate: Secondary | ICD-10-CM | POA: Diagnosis not present

## 2018-06-03 DIAGNOSIS — Z23 Encounter for immunization: Secondary | ICD-10-CM | POA: Diagnosis not present

## 2018-06-03 DIAGNOSIS — Z0000 Encounter for general adult medical examination without abnormal findings: Secondary | ICD-10-CM | POA: Diagnosis not present

## 2018-06-03 DIAGNOSIS — M545 Low back pain: Secondary | ICD-10-CM | POA: Diagnosis not present

## 2018-06-03 DIAGNOSIS — E7849 Other hyperlipidemia: Secondary | ICD-10-CM | POA: Diagnosis not present

## 2018-06-03 DIAGNOSIS — G4709 Other insomnia: Secondary | ICD-10-CM | POA: Diagnosis not present

## 2018-06-03 DIAGNOSIS — I10 Essential (primary) hypertension: Secondary | ICD-10-CM | POA: Diagnosis not present

## 2018-06-03 DIAGNOSIS — Z1389 Encounter for screening for other disorder: Secondary | ICD-10-CM | POA: Diagnosis not present

## 2018-06-12 DIAGNOSIS — Z1212 Encounter for screening for malignant neoplasm of rectum: Secondary | ICD-10-CM | POA: Diagnosis not present

## 2018-06-29 DIAGNOSIS — F4323 Adjustment disorder with mixed anxiety and depressed mood: Secondary | ICD-10-CM | POA: Diagnosis not present

## 2018-07-13 DIAGNOSIS — F4323 Adjustment disorder with mixed anxiety and depressed mood: Secondary | ICD-10-CM | POA: Diagnosis not present

## 2018-07-20 DIAGNOSIS — F4323 Adjustment disorder with mixed anxiety and depressed mood: Secondary | ICD-10-CM | POA: Diagnosis not present

## 2018-08-10 DIAGNOSIS — F4323 Adjustment disorder with mixed anxiety and depressed mood: Secondary | ICD-10-CM | POA: Diagnosis not present

## 2018-09-10 DIAGNOSIS — F4322 Adjustment disorder with anxiety: Secondary | ICD-10-CM | POA: Diagnosis not present

## 2018-09-15 DIAGNOSIS — F4322 Adjustment disorder with anxiety: Secondary | ICD-10-CM | POA: Diagnosis not present

## 2018-10-09 DIAGNOSIS — F4322 Adjustment disorder with anxiety: Secondary | ICD-10-CM | POA: Diagnosis not present

## 2018-10-23 DIAGNOSIS — F4322 Adjustment disorder with anxiety: Secondary | ICD-10-CM | POA: Diagnosis not present

## 2018-10-30 DIAGNOSIS — F4322 Adjustment disorder with anxiety: Secondary | ICD-10-CM | POA: Diagnosis not present

## 2018-11-10 DIAGNOSIS — F4322 Adjustment disorder with anxiety: Secondary | ICD-10-CM | POA: Diagnosis not present

## 2018-11-20 DIAGNOSIS — F4322 Adjustment disorder with anxiety: Secondary | ICD-10-CM | POA: Diagnosis not present

## 2018-11-23 DIAGNOSIS — F4322 Adjustment disorder with anxiety: Secondary | ICD-10-CM | POA: Diagnosis not present

## 2018-11-30 DIAGNOSIS — G4700 Insomnia, unspecified: Secondary | ICD-10-CM | POA: Diagnosis not present

## 2018-11-30 DIAGNOSIS — I10 Essential (primary) hypertension: Secondary | ICD-10-CM | POA: Diagnosis not present

## 2018-11-30 DIAGNOSIS — E663 Overweight: Secondary | ICD-10-CM | POA: Diagnosis not present

## 2018-11-30 DIAGNOSIS — M545 Low back pain: Secondary | ICD-10-CM | POA: Diagnosis not present

## 2019-01-06 DIAGNOSIS — F4322 Adjustment disorder with anxiety: Secondary | ICD-10-CM | POA: Diagnosis not present

## 2019-06-01 DIAGNOSIS — H5000 Unspecified esotropia: Secondary | ICD-10-CM | POA: Diagnosis not present

## 2019-06-01 DIAGNOSIS — H2513 Age-related nuclear cataract, bilateral: Secondary | ICD-10-CM | POA: Diagnosis not present

## 2019-06-01 DIAGNOSIS — H5203 Hypermetropia, bilateral: Secondary | ICD-10-CM | POA: Diagnosis not present

## 2019-06-01 DIAGNOSIS — H52203 Unspecified astigmatism, bilateral: Secondary | ICD-10-CM | POA: Diagnosis not present

## 2019-06-02 DIAGNOSIS — I10 Essential (primary) hypertension: Secondary | ICD-10-CM | POA: Diagnosis not present

## 2019-06-02 DIAGNOSIS — Z0000 Encounter for general adult medical examination without abnormal findings: Secondary | ICD-10-CM | POA: Diagnosis not present

## 2019-06-02 DIAGNOSIS — Z23 Encounter for immunization: Secondary | ICD-10-CM | POA: Diagnosis not present

## 2019-06-02 DIAGNOSIS — E7849 Other hyperlipidemia: Secondary | ICD-10-CM | POA: Diagnosis not present

## 2019-06-02 DIAGNOSIS — Z125 Encounter for screening for malignant neoplasm of prostate: Secondary | ICD-10-CM | POA: Diagnosis not present

## 2019-06-09 DIAGNOSIS — D225 Melanocytic nevi of trunk: Secondary | ICD-10-CM | POA: Diagnosis not present

## 2019-06-09 DIAGNOSIS — L821 Other seborrheic keratosis: Secondary | ICD-10-CM | POA: Diagnosis not present

## 2019-06-09 DIAGNOSIS — I10 Essential (primary) hypertension: Secondary | ICD-10-CM | POA: Diagnosis not present

## 2019-06-09 DIAGNOSIS — L814 Other melanin hyperpigmentation: Secondary | ICD-10-CM | POA: Diagnosis not present

## 2019-06-09 DIAGNOSIS — E785 Hyperlipidemia, unspecified: Secondary | ICD-10-CM | POA: Diagnosis not present

## 2019-06-09 DIAGNOSIS — L570 Actinic keratosis: Secondary | ICD-10-CM | POA: Diagnosis not present

## 2019-06-09 DIAGNOSIS — G4700 Insomnia, unspecified: Secondary | ICD-10-CM | POA: Diagnosis not present

## 2019-06-09 DIAGNOSIS — D1801 Hemangioma of skin and subcutaneous tissue: Secondary | ICD-10-CM | POA: Diagnosis not present

## 2019-06-09 DIAGNOSIS — M545 Low back pain: Secondary | ICD-10-CM | POA: Diagnosis not present

## 2019-06-09 DIAGNOSIS — Z0000 Encounter for general adult medical examination without abnormal findings: Secondary | ICD-10-CM | POA: Diagnosis not present

## 2019-06-09 DIAGNOSIS — R82998 Other abnormal findings in urine: Secondary | ICD-10-CM | POA: Diagnosis not present

## 2019-06-16 DIAGNOSIS — Z1212 Encounter for screening for malignant neoplasm of rectum: Secondary | ICD-10-CM | POA: Diagnosis not present

## 2019-08-05 ENCOUNTER — Encounter: Payer: Self-pay | Admitting: Internal Medicine

## 2019-08-17 ENCOUNTER — Encounter: Payer: Self-pay | Admitting: Internal Medicine

## 2019-08-28 DIAGNOSIS — Z20828 Contact with and (suspected) exposure to other viral communicable diseases: Secondary | ICD-10-CM | POA: Diagnosis not present

## 2019-09-09 ENCOUNTER — Other Ambulatory Visit: Payer: Self-pay

## 2019-09-09 ENCOUNTER — Encounter: Payer: Self-pay | Admitting: Internal Medicine

## 2019-09-09 ENCOUNTER — Ambulatory Visit (AMBULATORY_SURGERY_CENTER): Payer: BC Managed Care – PPO | Admitting: *Deleted

## 2019-09-09 VITALS — Temp 96.2°F | Ht 70.0 in | Wt 191.0 lb

## 2019-09-09 DIAGNOSIS — Z86010 Personal history of colonic polyps: Secondary | ICD-10-CM

## 2019-09-09 DIAGNOSIS — Z1159 Encounter for screening for other viral diseases: Secondary | ICD-10-CM

## 2019-09-09 MED ORDER — SUPREP BOWEL PREP KIT 17.5-3.13-1.6 GM/177ML PO SOLN: 1.0000 | Freq: Once | ORAL | 0 refills | Status: AC

## 2019-09-09 NOTE — Progress Notes (Signed)
No egg or soy allergy known to patient  No issues with past sedation with any surgeries  or procedures, no intubation problems  No diet pills per patient No home 02 use per patient  No blood thinners per patient  Pt denies issues with constipation  No A fib or A flutter  EMMI video sent to pt's e mail  suprep $15   Due to the COVID-19 pandemic we are asking patients to follow these guidelines. Please only bring one care partner. Please be aware that your care partner may wait in the car in the parking lot or if they feel like they will be too hot to wait in the car, they may wait in the lobby on the 4th floor. All care partners are required to wear a mask the entire time (we do not have any that we can provide them), they need to practice social distancing, and we will do a Covid check for all patient's and care partners when you arrive. Also we will check their temperature and your temperature. If the care partner waits in their car they need to stay in the parking lot the entire time and we will call them on their cell phone when the patient is ready for discharge so they can bring the car to the front of the building. Also all patient's will need to wear a mask into building.

## 2019-09-15 ENCOUNTER — Ambulatory Visit (INDEPENDENT_AMBULATORY_CARE_PROVIDER_SITE_OTHER): Payer: BC Managed Care – PPO

## 2019-09-15 DIAGNOSIS — Z1159 Encounter for screening for other viral diseases: Secondary | ICD-10-CM | POA: Diagnosis not present

## 2019-09-16 LAB — SARS CORONAVIRUS 2 (TAT 6-24 HRS): SARS Coronavirus 2: NEGATIVE

## 2019-09-18 ENCOUNTER — Telehealth: Payer: Self-pay | Admitting: Physician Assistant

## 2019-09-18 NOTE — Telephone Encounter (Signed)
09/18/2019 1056  Patient called on-call service this morning and reported that Tuesday, 09/14/2019 he was exposed to Covid through a coworker.  Discussed case with Dr. Bryan Lemma who recommends that we reschedule patient's elective colonoscopy.  I will route this message to Dr. Henrene Pastor and explained to the patient that we will call him on Monday and reschedule his procedure.  FYI Dr. Henrene Pastor.  Ellouise Newer, PA-C  Vaughan Basta- Can you please call and reschedule patient. Thanks-JLL

## 2019-09-19 NOTE — Telephone Encounter (Signed)
Linda, Please contact the patient (I know him) and reschedule anytime after 7 days if he remains asymptomatic. Of course, he will be tested pre procedure per our protocol. Thanks.

## 2019-09-20 ENCOUNTER — Encounter: Payer: Self-pay | Admitting: Internal Medicine

## 2019-09-20 NOTE — Telephone Encounter (Signed)
Appt cancelled for today. Left message for pt to call back.

## 2019-10-18 ENCOUNTER — Ambulatory Visit (INDEPENDENT_AMBULATORY_CARE_PROVIDER_SITE_OTHER): Payer: BC Managed Care – PPO

## 2019-10-18 ENCOUNTER — Other Ambulatory Visit: Payer: Self-pay | Admitting: Internal Medicine

## 2019-10-18 DIAGNOSIS — Z1159 Encounter for screening for other viral diseases: Secondary | ICD-10-CM | POA: Diagnosis not present

## 2019-10-18 LAB — SARS CORONAVIRUS 2 (TAT 6-24 HRS): SARS Coronavirus 2: NEGATIVE

## 2019-10-20 ENCOUNTER — Ambulatory Visit (AMBULATORY_SURGERY_CENTER): Payer: BC Managed Care – PPO | Admitting: Internal Medicine

## 2019-10-20 ENCOUNTER — Encounter: Payer: Self-pay | Admitting: Internal Medicine

## 2019-10-20 ENCOUNTER — Other Ambulatory Visit: Payer: Self-pay

## 2019-10-20 VITALS — BP 114/72 | HR 63 | Temp 97.6°F | Resp 12 | Ht 70.0 in | Wt 191.0 lb

## 2019-10-20 DIAGNOSIS — Z86010 Personal history of colonic polyps: Secondary | ICD-10-CM | POA: Diagnosis not present

## 2019-10-20 DIAGNOSIS — Z1211 Encounter for screening for malignant neoplasm of colon: Secondary | ICD-10-CM | POA: Diagnosis not present

## 2019-10-20 MED ORDER — SODIUM CHLORIDE 0.9 % IV SOLN: 500.0000 mL | Freq: Once | INTRAVENOUS | Status: DC

## 2019-10-20 NOTE — Progress Notes (Signed)
Pt's states no medical or surgical changes since previsit or office visit. 

## 2019-10-20 NOTE — Progress Notes (Signed)
To PACU, VSS. Report to Rn.tb 

## 2019-10-20 NOTE — Op Note (Signed)
[facility] Patient Name: Micheal Schultz Procedure Date: 10/20/2019 2:58 PM MRN: [id number] Endoscopist: Docia Chuck. Henrene Pastor , MD Age: 69 Referring MD:  Date of Birth: 07-14-1951 Gender: Male Account #: [id number] Procedure:                Colonoscopy Indications:              High risk colon cancer surveillance: Personal                            history of multiple (3 or more) adenomas, High risk                            colon cancer surveillance: Personal history of                            sessile serrated colon polyp (less than 10 mm in                            size) with no dysplasia. Previous examinations                            2005, 2010, 2015 Medicines:                Monitored Anesthesia Care Procedure:                Pre-Anesthesia Assessment:                           - Prior to the procedure, a History and Physical                            was performed, and patient medications and                            allergies were reviewed. The patient's tolerance of                            previous anesthesia was also reviewed. The risks                            and benefits of the procedure and the sedation                            options and risks were discussed with the patient.                            All questions were answered, and informed consent                            was obtained. Prior Anticoagulants: The patient has                            taken no previous anticoagulant or antiplatelet  agents. ASA Grade Assessment: I - A normal, healthy                            patient. After reviewing the risks and benefits,                            the patient was deemed in satisfactory condition to                            undergo the procedure.                           After obtaining informed consent, the colonoscope                            was passed under direct vision. Throughout the        procedure, the patient's blood pressure, pulse, and                            oxygen saturations were monitored continuously. The                            Colonoscope was introduced through the anus and                            advanced to the the cecum, identified by                            appendiceal orifice and ileocecal valve. The                            ileocecal valve, appendiceal orifice, and rectum                            were photographed. The quality of the bowel                            preparation was excellent. The colonoscopy was                            performed without difficulty. The patient tolerated                            the procedure well. The bowel preparation used was                            SUPREP via split dose instruction. Scope In: 3:10:17 PM Scope Out: 3:24:01 PM Scope Withdrawal Time: 0 hours 9 minutes 17 seconds  Total Procedure Duration: 0 hours 13 minutes 44 seconds  Findings:                 Multiple diverticula were found in the sigmoid  colon.                           The exam was otherwise without abnormality on                            direct and retroflexion views. Complications:            No immediate complications. Estimated blood loss:                            None. Estimated Blood Loss:     Estimated blood loss: none. Impression:               - Diverticulosis in the sigmoid colon.                           - The examination was otherwise normal on direct                            and retroflexion views.                           - No specimens collected. Recommendation:           - Repeat colonoscopy in 5 years for surveillance                            (personal history of multiple polyps).                           - Patient has a contact number available for                            emergencies. The signs and symptoms of potential                            delayed  complications were discussed with the                            patient. Return to normal activities tomorrow.                            Written discharge instructions were provided to the                            patient.                           - Resume previous diet.                           - Continue present medications. Docia Chuck. Henrene Pastor, MD 10/20/2019 3:29:51 PM This report has been signed electronically.

## 2019-10-20 NOTE — Patient Instructions (Signed)
Please read handouts provided. Continue present medications.     YOU HAD AN ENDOSCOPIC PROCEDURE TODAY AT THE [facility]:   Refer to the procedure report that was given to you for any specific questions about what was found during the examination.  If the procedure report does not answer your questions, please call your gastroenterologist to clarify.  If you requested that your care partner not be given the details of your procedure findings, then the procedure report has been included in a sealed envelope for you to review at your convenience later.  YOU SHOULD EXPECT: Some feelings of bloating in the abdomen. Passage of more gas than usual.  Walking can help get rid of the air that was put into your GI tract during the procedure and reduce the bloating. If you had a lower endoscopy (such as a colonoscopy or flexible sigmoidoscopy) you may notice spotting of blood in your stool or on the toilet paper. If you underwent a bowel prep for your procedure, you may not have a normal bowel movement for a few days.  Please Note:  You might notice some irritation and congestion in your nose or some drainage.  This is from the oxygen used during your procedure.  There is no need for concern and it should clear up in a day or so.  SYMPTOMS TO REPORT IMMEDIATELY:   Following lower endoscopy (colonoscopy or flexible sigmoidoscopy):  Excessive amounts of blood in the stool  Significant tenderness or worsening of abdominal pains  Swelling of the abdomen that is new, acute  Fever of 100F or higher   For urgent or emergent issues, a gastroenterologist can be reached at any hour by calling [phone].   DIET:  We do recommend a small meal at first, but then you may proceed to your regular diet.  Drink plenty of fluids but you should avoid alcoholic beverages for 24 hours.  ACTIVITY:  You should plan to take it easy for the rest of today and you should NOT DRIVE or use heavy machinery  until tomorrow (because of the sedation medicines used during the test).    FOLLOW UP: Our staff will call the number listed on your records 48-72 hours following your procedure to check on you and address any questions or concerns that you may have regarding the information given to you following your procedure. If we do not reach you, we will leave a message.  We will attempt to reach you two times.  During this call, we will ask if you have developed any symptoms of COVID 19. If you develop any symptoms (ie: fever, flu-like symptoms, shortness of breath, cough etc.) before then, please call [phone].  If you test positive for Covid 19 in the 2 weeks post procedure, please call and report this information to us.    If any biopsies were taken you will be contacted by phone or by letter within the next 1-3 weeks.  Please call us at [phone] if you have not heard about the biopsies in 3 weeks.    SIGNATURES/CONFIDENTIALITY: You and/or your care partner have signed paperwork which will be entered into your electronic medical record.  These signatures attest to the fact that that the information above on your After Visit Summary has been reviewed and is understood.  Full responsibility of the confidentiality of this discharge information lies with you and/or your care-partner. 

## 2019-10-22 ENCOUNTER — Telehealth: Payer: Self-pay

## 2019-10-22 NOTE — Telephone Encounter (Signed)
Second post procedure follow up call, no answer 

## 2019-10-22 NOTE — Telephone Encounter (Signed)
Left message on follow up call. 

## 2019-10-28 ENCOUNTER — Ambulatory Visit: Payer: BC Managed Care – PPO

## 2019-11-06 ENCOUNTER — Ambulatory Visit: Payer: BC Managed Care – PPO | Attending: Internal Medicine

## 2019-11-06 DIAGNOSIS — Z23 Encounter for immunization: Secondary | ICD-10-CM | POA: Insufficient documentation

## 2019-11-06 NOTE — Progress Notes (Signed)
   Covid-19 Vaccination Clinic  Name:  Micheal Schultz    MRN: [id number] DOB: 1950-12-13  11/06/2019  [name] was observed post Covid-19 immunization for 15 minutes without incidence. He was provided with Vaccine Information Sheet and instruction to access the V-Safe system.   [name] was instructed to call 911 with any severe reactions post vaccine: Marland Kitchen Difficulty breathing  . Swelling of your face and throat  . A fast heartbeat  . A bad rash all over your body  . Dizziness and weakness    Immunizations Administered    Name Date Dose VIS Date Route   Pfizer COVID-19 Vaccine 11/06/2019  8:22 AM 0.3 mL 09/10/2019 Intramuscular   Manufacturer: [place]   Lot: [id number]   [place]: [id number]

## 2019-11-14 ENCOUNTER — Ambulatory Visit: Payer: BC Managed Care – PPO

## 2019-11-30 ENCOUNTER — Ambulatory Visit: Payer: BC Managed Care – PPO

## 2019-11-30 ENCOUNTER — Ambulatory Visit: Payer: BC Managed Care – PPO | Attending: Internal Medicine

## 2019-11-30 DIAGNOSIS — Z23 Encounter for immunization: Secondary | ICD-10-CM

## 2019-11-30 NOTE — Progress Notes (Signed)
   Covid-19 Vaccination Clinic  Name:  Micheal Schultz    MRN: [id number] DOB: [birth year]  11/30/2019  Micheal Schultz was observed post Covid-19 immunization for 15 minutes without incident. He was provided with Vaccine Information Sheet and instruction to access the V-Safe system.   Micheal Schultz was instructed to call 911 with any severe reactions post vaccine: Marland Kitchen Difficulty breathing  . Swelling of face and throat  . A fast heartbeat  . A bad rash all over body  . Dizziness and weakness   Immunizations Administered    Name Date Dose VIS Date Route   Pfizer COVID-19 Vaccine 11/30/2019  1:11 PM 0.3 mL 09/10/2019 Intramuscular   Manufacturer: [place]   Lot: [id number]   [place]: [id number]

## 2019-12-08 DIAGNOSIS — E663 Overweight: Secondary | ICD-10-CM | POA: Diagnosis not present

## 2019-12-08 DIAGNOSIS — Z1331 Encounter for screening for depression: Secondary | ICD-10-CM | POA: Diagnosis not present

## 2019-12-08 DIAGNOSIS — M545 Low back pain: Secondary | ICD-10-CM | POA: Diagnosis not present

## 2019-12-08 DIAGNOSIS — I10 Essential (primary) hypertension: Secondary | ICD-10-CM | POA: Diagnosis not present

## 2020-06-09 DIAGNOSIS — Z125 Encounter for screening for malignant neoplasm of prostate: Secondary | ICD-10-CM | POA: Diagnosis not present

## 2020-06-09 DIAGNOSIS — D225 Melanocytic nevi of trunk: Secondary | ICD-10-CM | POA: Diagnosis not present

## 2020-06-09 DIAGNOSIS — L570 Actinic keratosis: Secondary | ICD-10-CM | POA: Diagnosis not present

## 2020-06-09 DIAGNOSIS — L814 Other melanin hyperpigmentation: Secondary | ICD-10-CM | POA: Diagnosis not present

## 2020-06-09 DIAGNOSIS — E785 Hyperlipidemia, unspecified: Secondary | ICD-10-CM | POA: Diagnosis not present

## 2020-06-09 DIAGNOSIS — I10 Essential (primary) hypertension: Secondary | ICD-10-CM | POA: Diagnosis not present

## 2020-06-09 DIAGNOSIS — Z0000 Encounter for general adult medical examination without abnormal findings: Secondary | ICD-10-CM | POA: Diagnosis not present

## 2020-06-09 DIAGNOSIS — L821 Other seborrheic keratosis: Secondary | ICD-10-CM | POA: Diagnosis not present

## 2020-06-12 DIAGNOSIS — I10 Essential (primary) hypertension: Secondary | ICD-10-CM | POA: Diagnosis not present

## 2020-06-12 DIAGNOSIS — Z0000 Encounter for general adult medical examination without abnormal findings: Secondary | ICD-10-CM | POA: Diagnosis not present

## 2020-06-13 DIAGNOSIS — R82998 Other abnormal findings in urine: Secondary | ICD-10-CM | POA: Diagnosis not present

## 2020-06-29 DIAGNOSIS — H5203 Hypermetropia, bilateral: Secondary | ICD-10-CM | POA: Diagnosis not present

## 2020-06-29 DIAGNOSIS — H52203 Unspecified astigmatism, bilateral: Secondary | ICD-10-CM | POA: Diagnosis not present

## 2020-06-29 DIAGNOSIS — H2513 Age-related nuclear cataract, bilateral: Secondary | ICD-10-CM | POA: Diagnosis not present

## 2020-06-29 DIAGNOSIS — H53002 Unspecified amblyopia, left eye: Secondary | ICD-10-CM | POA: Diagnosis not present

## 2020-07-07 DIAGNOSIS — K921 Melena: Secondary | ICD-10-CM | POA: Diagnosis not present

## 2020-09-17 DIAGNOSIS — Z20822 Contact with and (suspected) exposure to covid-19: Secondary | ICD-10-CM | POA: Diagnosis not present

## 2020-10-25 DIAGNOSIS — Z20822 Contact with and (suspected) exposure to covid-19: Secondary | ICD-10-CM | POA: Diagnosis not present

## 2020-12-11 DIAGNOSIS — Z741 Need for assistance with personal care: Secondary | ICD-10-CM | POA: Diagnosis not present

## 2020-12-11 DIAGNOSIS — Z1331 Encounter for screening for depression: Secondary | ICD-10-CM | POA: Diagnosis not present

## 2020-12-11 DIAGNOSIS — Z1339 Encounter for screening examination for other mental health and behavioral disorders: Secondary | ICD-10-CM | POA: Diagnosis not present

## 2020-12-11 DIAGNOSIS — E785 Hyperlipidemia, unspecified: Secondary | ICD-10-CM | POA: Diagnosis not present

## 2021-06-25 DIAGNOSIS — Z125 Encounter for screening for malignant neoplasm of prostate: Secondary | ICD-10-CM | POA: Diagnosis not present

## 2021-06-25 DIAGNOSIS — E785 Hyperlipidemia, unspecified: Secondary | ICD-10-CM | POA: Diagnosis not present

## 2021-06-29 DIAGNOSIS — L821 Other seborrheic keratosis: Secondary | ICD-10-CM | POA: Diagnosis not present

## 2021-06-29 DIAGNOSIS — D1801 Hemangioma of skin and subcutaneous tissue: Secondary | ICD-10-CM | POA: Diagnosis not present

## 2021-06-29 DIAGNOSIS — R233 Spontaneous ecchymoses: Secondary | ICD-10-CM | POA: Diagnosis not present

## 2021-06-29 DIAGNOSIS — D225 Melanocytic nevi of trunk: Secondary | ICD-10-CM | POA: Diagnosis not present

## 2021-06-29 DIAGNOSIS — C44519 Basal cell carcinoma of skin of other part of trunk: Secondary | ICD-10-CM | POA: Diagnosis not present

## 2021-06-29 DIAGNOSIS — L570 Actinic keratosis: Secondary | ICD-10-CM | POA: Diagnosis not present

## 2021-07-02 DIAGNOSIS — I10 Essential (primary) hypertension: Secondary | ICD-10-CM | POA: Diagnosis not present

## 2021-07-02 DIAGNOSIS — Z0000 Encounter for general adult medical examination without abnormal findings: Secondary | ICD-10-CM | POA: Diagnosis not present

## 2021-07-02 DIAGNOSIS — Z23 Encounter for immunization: Secondary | ICD-10-CM | POA: Diagnosis not present

## 2021-07-02 DIAGNOSIS — Z1331 Encounter for screening for depression: Secondary | ICD-10-CM | POA: Diagnosis not present

## 2021-07-02 DIAGNOSIS — Z1339 Encounter for screening examination for other mental health and behavioral disorders: Secondary | ICD-10-CM | POA: Diagnosis not present

## 2021-07-03 DIAGNOSIS — H5203 Hypermetropia, bilateral: Secondary | ICD-10-CM | POA: Diagnosis not present

## 2021-07-03 DIAGNOSIS — H524 Presbyopia: Secondary | ICD-10-CM | POA: Diagnosis not present

## 2021-07-03 DIAGNOSIS — H2513 Age-related nuclear cataract, bilateral: Secondary | ICD-10-CM | POA: Diagnosis not present

## 2021-07-03 DIAGNOSIS — I10 Essential (primary) hypertension: Secondary | ICD-10-CM | POA: Diagnosis not present

## 2021-07-03 DIAGNOSIS — R82998 Other abnormal findings in urine: Secondary | ICD-10-CM | POA: Diagnosis not present

## 2021-07-03 DIAGNOSIS — Z1212 Encounter for screening for malignant neoplasm of rectum: Secondary | ICD-10-CM | POA: Diagnosis not present

## 2021-07-03 DIAGNOSIS — H52203 Unspecified astigmatism, bilateral: Secondary | ICD-10-CM | POA: Diagnosis not present

## 2021-07-09 DIAGNOSIS — C44519 Basal cell carcinoma of skin of other part of trunk: Secondary | ICD-10-CM | POA: Diagnosis not present

## 2022-01-02 DIAGNOSIS — I10 Essential (primary) hypertension: Secondary | ICD-10-CM | POA: Diagnosis not present

## 2022-07-02 DIAGNOSIS — Z125 Encounter for screening for malignant neoplasm of prostate: Secondary | ICD-10-CM | POA: Diagnosis not present

## 2022-07-02 DIAGNOSIS — E785 Hyperlipidemia, unspecified: Secondary | ICD-10-CM | POA: Diagnosis not present

## 2022-07-02 DIAGNOSIS — Z1212 Encounter for screening for malignant neoplasm of rectum: Secondary | ICD-10-CM | POA: Diagnosis not present

## 2022-07-04 DIAGNOSIS — H02834 Dermatochalasis of left upper eyelid: Secondary | ICD-10-CM | POA: Diagnosis not present

## 2022-07-04 DIAGNOSIS — H5203 Hypermetropia, bilateral: Secondary | ICD-10-CM | POA: Diagnosis not present

## 2022-07-04 DIAGNOSIS — H02831 Dermatochalasis of right upper eyelid: Secondary | ICD-10-CM | POA: Diagnosis not present

## 2022-07-05 DIAGNOSIS — R82998 Other abnormal findings in urine: Secondary | ICD-10-CM | POA: Diagnosis not present

## 2022-07-05 DIAGNOSIS — I10 Essential (primary) hypertension: Secondary | ICD-10-CM | POA: Diagnosis not present

## 2022-07-08 DIAGNOSIS — R52 Pain, unspecified: Secondary | ICD-10-CM | POA: Diagnosis not present

## 2022-07-15 DIAGNOSIS — Z1331 Encounter for screening for depression: Secondary | ICD-10-CM | POA: Diagnosis not present

## 2022-07-15 DIAGNOSIS — Z23 Encounter for immunization: Secondary | ICD-10-CM | POA: Diagnosis not present

## 2022-07-15 DIAGNOSIS — Z1339 Encounter for screening examination for other mental health and behavioral disorders: Secondary | ICD-10-CM | POA: Diagnosis not present

## 2022-07-15 DIAGNOSIS — I10 Essential (primary) hypertension: Secondary | ICD-10-CM | POA: Diagnosis not present

## 2022-07-15 DIAGNOSIS — Z0000 Encounter for general adult medical examination without abnormal findings: Secondary | ICD-10-CM | POA: Diagnosis not present

## 2022-07-22 DIAGNOSIS — L820 Inflamed seborrheic keratosis: Secondary | ICD-10-CM | POA: Diagnosis not present

## 2022-07-22 DIAGNOSIS — D485 Neoplasm of uncertain behavior of skin: Secondary | ICD-10-CM | POA: Diagnosis not present

## 2022-07-22 DIAGNOSIS — D225 Melanocytic nevi of trunk: Secondary | ICD-10-CM | POA: Diagnosis not present

## 2022-07-22 DIAGNOSIS — L570 Actinic keratosis: Secondary | ICD-10-CM | POA: Diagnosis not present

## 2022-07-22 DIAGNOSIS — L821 Other seborrheic keratosis: Secondary | ICD-10-CM | POA: Diagnosis not present

## 2022-07-22 DIAGNOSIS — D2261 Melanocytic nevi of right upper limb, including shoulder: Secondary | ICD-10-CM | POA: Diagnosis not present

## 2022-07-22 DIAGNOSIS — Z85828 Personal history of other malignant neoplasm of skin: Secondary | ICD-10-CM | POA: Diagnosis not present

## 2023-01-22 DIAGNOSIS — E663 Overweight: Secondary | ICD-10-CM | POA: Diagnosis not present

## 2023-01-22 DIAGNOSIS — I10 Essential (primary) hypertension: Secondary | ICD-10-CM | POA: Diagnosis not present

## 2023-01-22 DIAGNOSIS — E785 Hyperlipidemia, unspecified: Secondary | ICD-10-CM | POA: Diagnosis not present

## 2023-07-08 DIAGNOSIS — H02834 Dermatochalasis of left upper eyelid: Secondary | ICD-10-CM | POA: Diagnosis not present

## 2023-07-08 DIAGNOSIS — H5203 Hypermetropia, bilateral: Secondary | ICD-10-CM | POA: Diagnosis not present

## 2023-07-08 DIAGNOSIS — H02831 Dermatochalasis of right upper eyelid: Secondary | ICD-10-CM | POA: Diagnosis not present

## 2023-07-08 DIAGNOSIS — H2513 Age-related nuclear cataract, bilateral: Secondary | ICD-10-CM | POA: Diagnosis not present

## 2023-07-16 DIAGNOSIS — Z1389 Encounter for screening for other disorder: Secondary | ICD-10-CM | POA: Diagnosis not present

## 2023-07-23 DIAGNOSIS — Z23 Encounter for immunization: Secondary | ICD-10-CM | POA: Diagnosis not present

## 2023-07-23 DIAGNOSIS — Z1389 Encounter for screening for other disorder: Secondary | ICD-10-CM | POA: Diagnosis not present

## 2023-07-23 DIAGNOSIS — R82998 Other abnormal findings in urine: Secondary | ICD-10-CM | POA: Diagnosis not present

## 2023-07-23 DIAGNOSIS — Z0000 Encounter for general adult medical examination without abnormal findings: Secondary | ICD-10-CM | POA: Diagnosis not present

## 2023-07-23 DIAGNOSIS — Z1339 Encounter for screening examination for other mental health and behavioral disorders: Secondary | ICD-10-CM | POA: Diagnosis not present

## 2023-07-23 DIAGNOSIS — I10 Essential (primary) hypertension: Secondary | ICD-10-CM | POA: Diagnosis not present

## 2023-07-23 DIAGNOSIS — Z1331 Encounter for screening for depression: Secondary | ICD-10-CM | POA: Diagnosis not present

## 2023-07-28 DIAGNOSIS — Z85828 Personal history of other malignant neoplasm of skin: Secondary | ICD-10-CM | POA: Diagnosis not present

## 2023-07-28 DIAGNOSIS — D2261 Melanocytic nevi of right upper limb, including shoulder: Secondary | ICD-10-CM | POA: Diagnosis not present

## 2023-07-28 DIAGNOSIS — L814 Other melanin hyperpigmentation: Secondary | ICD-10-CM | POA: Diagnosis not present

## 2023-07-28 DIAGNOSIS — L570 Actinic keratosis: Secondary | ICD-10-CM | POA: Diagnosis not present

## 2023-07-28 DIAGNOSIS — L821 Other seborrheic keratosis: Secondary | ICD-10-CM | POA: Diagnosis not present

## 2023-08-20 DIAGNOSIS — M7542 Impingement syndrome of left shoulder: Secondary | ICD-10-CM | POA: Diagnosis not present

## 2024-01-21 DIAGNOSIS — I10 Essential (primary) hypertension: Secondary | ICD-10-CM | POA: Diagnosis not present

## 2024-07-23 DIAGNOSIS — E785 Hyperlipidemia, unspecified: Secondary | ICD-10-CM | POA: Diagnosis not present

## 2024-07-23 DIAGNOSIS — Z0189 Encounter for other specified special examinations: Secondary | ICD-10-CM | POA: Diagnosis not present

## 2024-07-23 DIAGNOSIS — Z125 Encounter for screening for malignant neoplasm of prostate: Secondary | ICD-10-CM | POA: Diagnosis not present

## 2024-07-28 DIAGNOSIS — L821 Other seborrheic keratosis: Secondary | ICD-10-CM | POA: Diagnosis not present

## 2024-07-28 DIAGNOSIS — C44629 Squamous cell carcinoma of skin of left upper limb, including shoulder: Secondary | ICD-10-CM | POA: Diagnosis not present

## 2024-07-28 DIAGNOSIS — R82998 Other abnormal findings in urine: Secondary | ICD-10-CM | POA: Diagnosis not present

## 2024-07-28 DIAGNOSIS — Z1331 Encounter for screening for depression: Secondary | ICD-10-CM | POA: Diagnosis not present

## 2024-07-28 DIAGNOSIS — L570 Actinic keratosis: Secondary | ICD-10-CM | POA: Diagnosis not present

## 2024-07-28 DIAGNOSIS — D225 Melanocytic nevi of trunk: Secondary | ICD-10-CM | POA: Diagnosis not present

## 2024-07-28 DIAGNOSIS — Z0000 Encounter for general adult medical examination without abnormal findings: Secondary | ICD-10-CM | POA: Diagnosis not present

## 2024-07-28 DIAGNOSIS — Z1339 Encounter for screening examination for other mental health and behavioral disorders: Secondary | ICD-10-CM | POA: Diagnosis not present

## 2024-07-28 DIAGNOSIS — L814 Other melanin hyperpigmentation: Secondary | ICD-10-CM | POA: Diagnosis not present

## 2024-07-28 DIAGNOSIS — Z85828 Personal history of other malignant neoplasm of skin: Secondary | ICD-10-CM | POA: Diagnosis not present

## 2024-07-28 DIAGNOSIS — I10 Essential (primary) hypertension: Secondary | ICD-10-CM | POA: Diagnosis not present

## 2024-07-28 DIAGNOSIS — Z23 Encounter for immunization: Secondary | ICD-10-CM | POA: Diagnosis not present

## 2024-08-16 DIAGNOSIS — H02831 Dermatochalasis of right upper eyelid: Secondary | ICD-10-CM | POA: Diagnosis not present

## 2024-08-16 DIAGNOSIS — H52203 Unspecified astigmatism, bilateral: Secondary | ICD-10-CM | POA: Diagnosis not present

## 2024-08-16 DIAGNOSIS — H02834 Dermatochalasis of left upper eyelid: Secondary | ICD-10-CM | POA: Diagnosis not present

## 2024-10-07 ENCOUNTER — Encounter: Payer: Self-pay | Admitting: Internal Medicine

## 2024-11-17 ENCOUNTER — Ambulatory Visit: Admitting: Internal Medicine
